# Patient Record
Sex: Male | Born: 1970 | Hispanic: Yes | Marital: Single | Smoking: Never smoker
Health system: Southern US, Community
[De-identification: ages and names within clinical notes are randomized; demographics above are authoritative.]

---

## 2017-07-01 ENCOUNTER — Encounter (HOSPITAL_COMMUNITY): Payer: Self-pay | Admitting: Emergency Medicine

## 2017-07-01 ENCOUNTER — Emergency Department (HOSPITAL_COMMUNITY)
Admission: EM | Admit: 2017-07-01 | Discharge: 2017-07-01 | Disposition: A | Discharge status: Home / Self Care | Payer: Worker's Compensation | Attending: Emergency Medicine | Admitting: Emergency Medicine

## 2017-07-01 ENCOUNTER — Emergency Department (HOSPITAL_COMMUNITY): Payer: Worker's Compensation

## 2017-07-01 DIAGNOSIS — Z23 Encounter for immunization: Secondary | ICD-10-CM | POA: Diagnosis not present

## 2017-07-01 DIAGNOSIS — Y939 Activity, unspecified: Secondary | ICD-10-CM | POA: Insufficient documentation

## 2017-07-01 DIAGNOSIS — W1789XA Other fall from one level to another, initial encounter: Secondary | ICD-10-CM | POA: Diagnosis not present

## 2017-07-01 DIAGNOSIS — Y990 Civilian activity done for income or pay: Secondary | ICD-10-CM | POA: Diagnosis not present

## 2017-07-01 DIAGNOSIS — S0101XA Laceration without foreign body of scalp, initial encounter: Secondary | ICD-10-CM | POA: Insufficient documentation

## 2017-07-01 DIAGNOSIS — S62524A Nondisplaced fracture of distal phalanx of right thumb, initial encounter for closed fracture: Secondary | ICD-10-CM

## 2017-07-01 DIAGNOSIS — S0990XA Unspecified injury of head, initial encounter: Secondary | ICD-10-CM | POA: Diagnosis present

## 2017-07-01 DIAGNOSIS — Y929 Unspecified place or not applicable: Secondary | ICD-10-CM | POA: Insufficient documentation

## 2017-07-01 DIAGNOSIS — S62522A Displaced fracture of distal phalanx of left thumb, initial encounter for closed fracture: Secondary | ICD-10-CM | POA: Insufficient documentation

## 2017-07-01 MED ORDER — TETANUS-DIPHTH-ACELL PERTUSSIS 5-2.5-18.5 LF-MCG/0.5 IM SUSP
0.5000 mL | Freq: Once | INTRAMUSCULAR | Status: AC
  Administered 2017-07-01: 0.5 mL via INTRAMUSCULAR
  Filled 2017-07-01: qty 0.5

## 2017-07-01 MED ORDER — HYDROCODONE-ACETAMINOPHEN 5-325 MG PO TABS: 2.0000 | ORAL_TABLET | ORAL | 0 refills | Status: AC | PRN

## 2017-07-01 NOTE — ED Triage Notes (Signed)
Pt comes in after being at work and falling off a 3 foot piece of machinery and hit his head on the ground and injured his left thumb joint and his right middle finger. Pt ambulatory and A&O x4 at this time.  Small 1 cm laceration noted to top of head with bleeding controlled at this time.  No LOC but does endorse feeling dizzy when he hit his head.  No blood thinners. Unsure of last tetanus.  Pt employer at bedside and denies the need for a urine drug screen.

## 2017-07-03 NOTE — ED Provider Notes (Signed)
[facility]-EMERGENCY DEPT Provider Note   CSN: [id number] Arrival date & time: 07/01/17  1306     History   Chief Complaint Chief Complaint  Patient presents with  . Finger Injury  . Head Injury    HPI Danny Owen is a 46 y.o. male.  The history is provided by the patient. No language interpreter was used.  Head Injury   The incident occurred more than 2 days ago. He came to the ER via walk-in. There was no loss of consciousness. The volume of blood lost was moderate. The pain is at a severity of 5/10. The pain is moderate. The pain has been constant since the injury. He was found conscious by EMS personnel. He has tried nothing for the symptoms. The treatment provided no relief.  Pt complains of falling and hit his  head. Pt also hit his left thumb and his right middle finger   History reviewed. No pertinent past medical history.  There are no active problems to display for this patient.   History reviewed. No pertinent surgical history.     Home Medications    Prior to Admission medications   Medication Sig Start Date End Date Taking? Authorizing Provider  HYDROcodone-acetaminophen (NORCO/VICODIN) 5-325 MG tablet Take 2 tablets by mouth every 4 (four) hours as needed. 07/01/17   Elson AreasSofia, Leslie K, PA-C    Family History No family history on file.  Social History Social History   Tobacco Use  . Smoking status: Never Smoker  . Smokeless tobacco: Never Used  Substance Use Topics  . Alcohol use: No    Frequency: Never  . Drug use: No     Allergies   Patient has no known allergies.   Review of Systems Review of Systems  All other systems reviewed and are negative.    Physical Exam Updated Vital Signs BP 130/78 (BP Location: Right Arm)   Pulse 68   Temp 98.1 F (36.7 C) (Oral)   Resp 18   SpO2 100%   Physical Exam  Constitutional: He appears well-developed and well-nourished.  HENT:  Head: Normocephalic.  5mm  superficial laceration scalp  No gapping  Eyes: Pupils are equal, round, and reactive to light.  Neck: Normal range of motion.  Cardiovascular: Normal rate and regular rhythm.  Pulmonary/Chest: Effort normal.  Abdominal: Soft.  Musculoskeletal: Normal range of motion.  Swollen left thumb,  Tender distal and proximal phalanx from  nv and ns intact  Neurological: He is alert.  Skin: Skin is warm.  Nursing note and vitals reviewed.    ED Treatments / Results  Labs (all labs ordered are listed, but only abnormal results are displayed) Labs Reviewed - No data to display  EKG  EKG Interpretation None       Radiology Dg Hand Complete Left  Result Date: 07/01/2017 CLINICAL DATA:  Fall with thumb injury. EXAM: LEFT HAND - COMPLETE 3+ VIEW COMPARISON:  None. FINDINGS: There is a minimally displaced transverse fracture of the left thumb distal phalanx. Additionally, there is subluxation at the first metacarpophalangeal joint in the volar and medial directions. There is irregularity at the distal aspect of the first metacarpal, which is superimposed on findings of osteoarthrosis and/or old trauma at this location. There is a metallic foreign body in the soft tissues of the left wrist, adjacent to the lateral aspect of the radius. IMPRESSION: 1. Acute transverse fracture of the left thumb distal phalanx, minimally displaced. 2. Volar and medial subluxation of the first  metacarpophalangeal joint. Possible fracture of the distal left thumb metacarpal superimposed on chronic osteoarthritis and/or old trauma. Electronically Signed   By: Deatra RobinsonKevin  Herman M.D.   On: 07/01/2017 14:14   Dg Finger Middle Right  Result Date: 07/01/2017 CLINICAL DATA:  Fall EXAM: RIGHT MIDDLE FINGER 2+V COMPARISON:  None. FINDINGS: There is no evidence of fracture or dislocation. There is no evidence of arthropathy or other focal bone abnormality. Soft tissues are unremarkable. IMPRESSION: No fracture or dislocation of the  right middle finger. Electronically Signed   By: Deatra RobinsonKevin  Herman M.D.   On: 07/01/2017 14:16    Procedures Procedures (including critical care time)  Medications Ordered in ED Medications  Tdap (BOOSTRIX) injection 0.5 mL (0.5 mLs Intramuscular Given 07/01/17 1535)     Initial Impression / Assessment and Plan / ED Course  I have reviewed the triage vital signs and the nursing notes.  Pertinent labs & imaging results that were available during my care of the patient were reviewed by me and considered in my medical decision making (see chart for details).     Pt placed in a splint.   Pt advised he will need to see Hand surgeon for evaluation    Final Clinical Impressions(s) / ED Diagnoses   Final diagnoses:  Laceration of scalp, initial encounter  Closed nondisplaced fracture of distal phalanx of right thumb, initial encounter    ED Discharge Orders        Ordered    HYDROcodone-acetaminophen (NORCO/VICODIN) 5-325 MG tablet  Every 4 hours PRN     07/01/17 1524    An After Visit Summary was printed and given to the patient.   Elson AreasSofia, Leslie K, [place]-C 07/03/17 [id number]    Gerhard MunchLockwood, Robert, MD 07/03/17 [phone]

## 2018-12-21 IMAGING — CR DG HAND COMPLETE 3+V*L*
3 series · 3 of 3 positions shown · non-contrast
Comparison: None.

CLINICAL DATA: Fall with thumb injury.

EXAM:
LEFT HAND - COMPLETE 3+ VIEW

[x hand pa left]
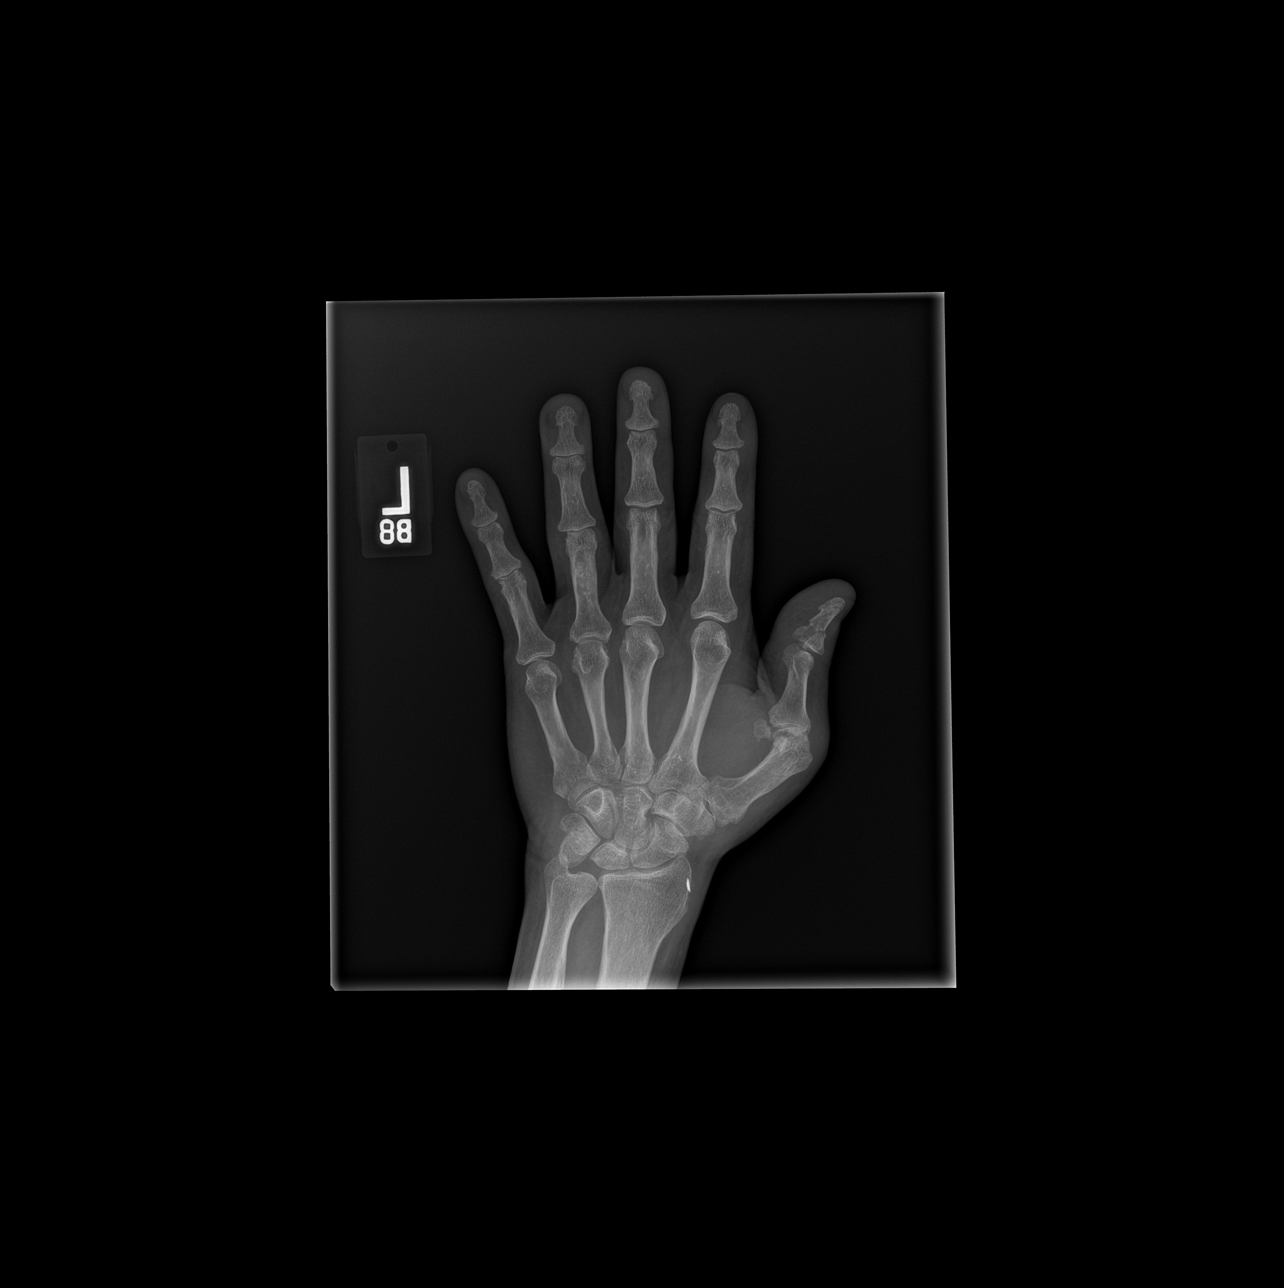

[x hand obl left]
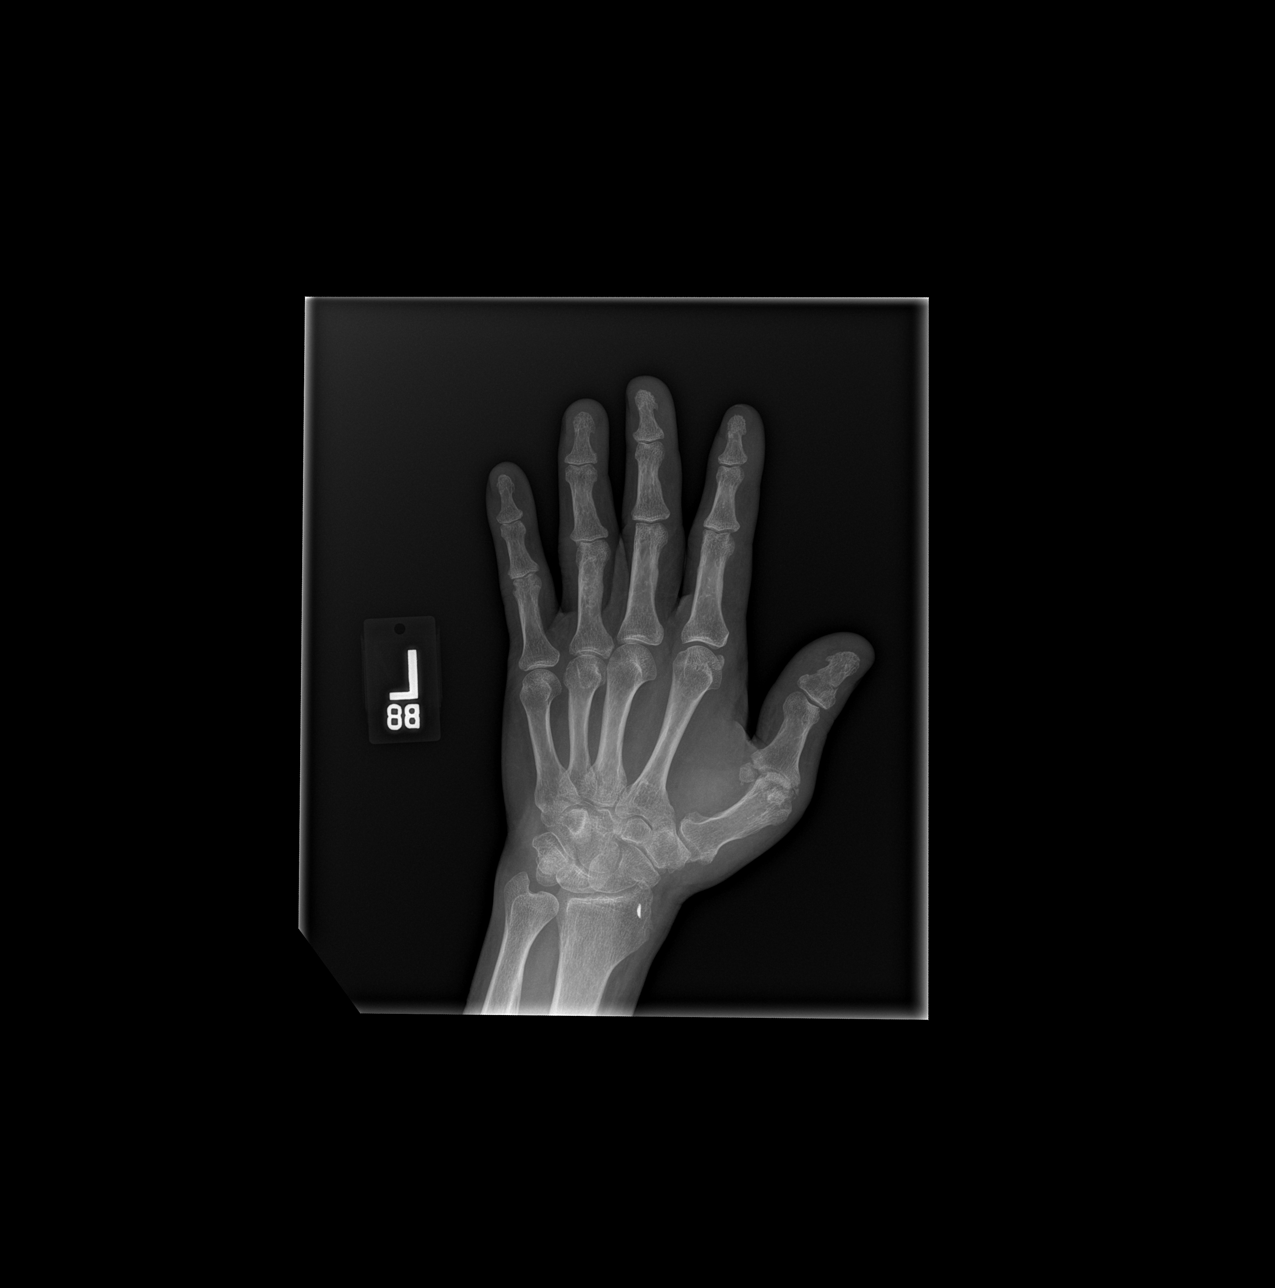

[x hand lat left]
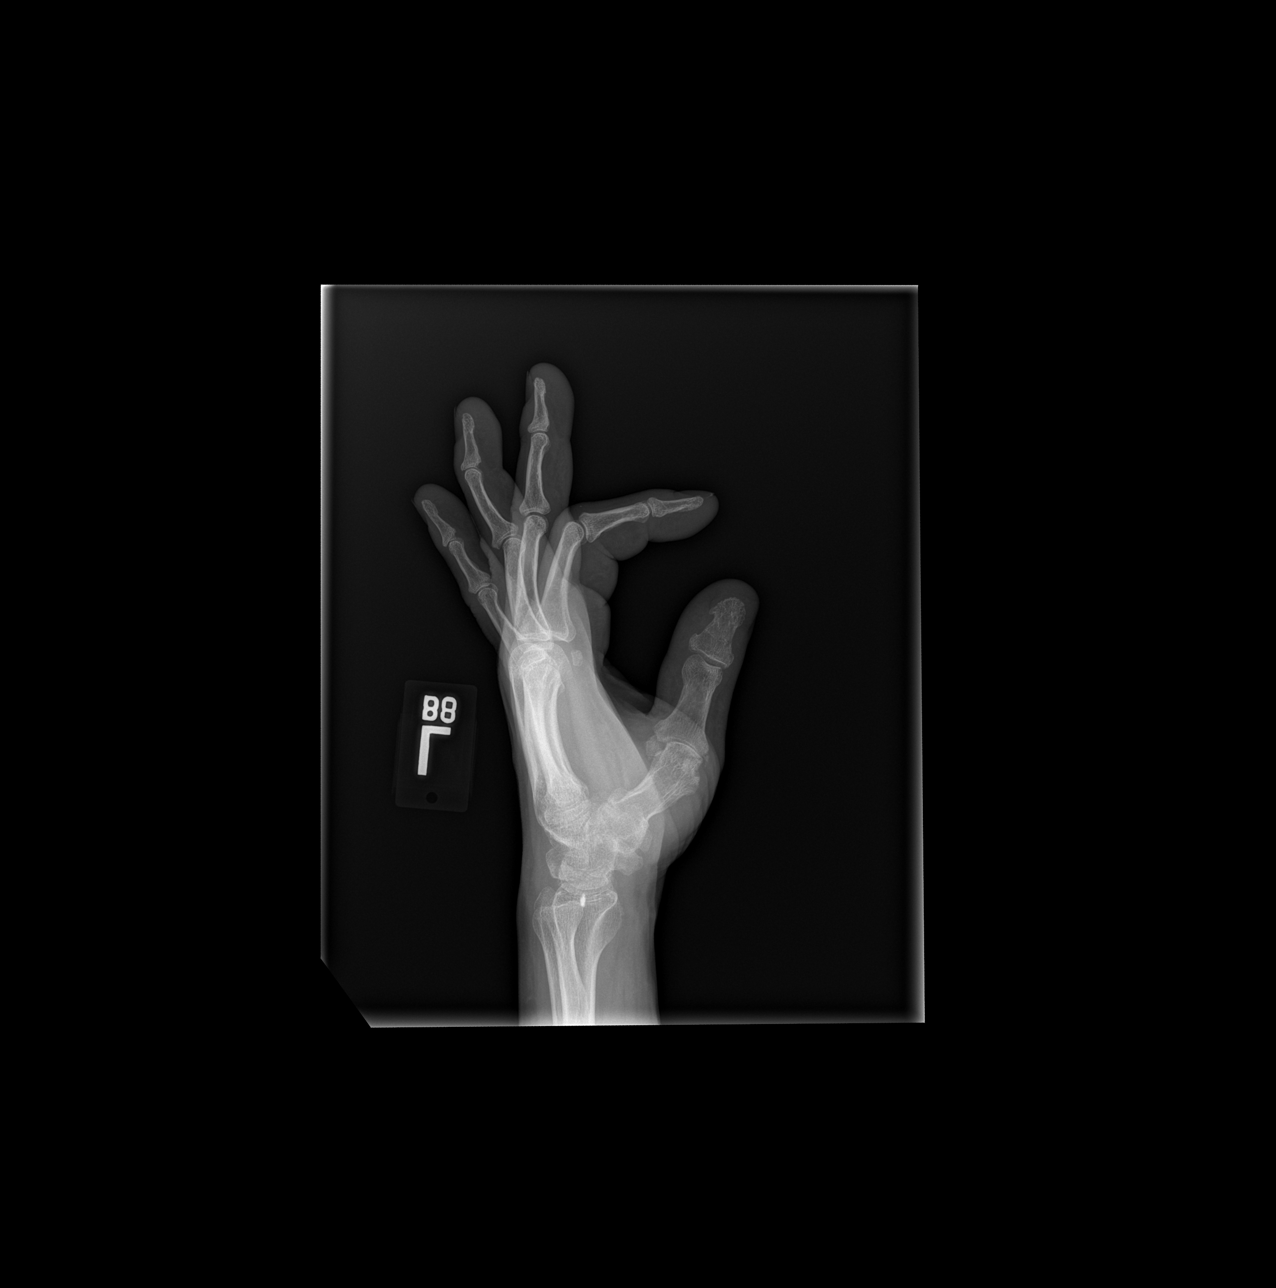

[3 of 3 positions shown; findings below may reference images not displayed]

FINDINGS: There is a minimally displaced transverse fracture of the left thumb
distal phalanx. Additionally, there is subluxation at the first
metacarpophalangeal joint in the volar and medial directions. There
is irregularity at the distal aspect of the first metacarpal, which
is superimposed on findings of osteoarthrosis and/or old trauma at
this location. There is a metallic foreign body in the soft tissues
of the left wrist, adjacent to the lateral aspect of the radius.
IMPRESSION: 1. Acute transverse fracture of the left thumb distal phalanx,
minimally displaced.
2. Volar and medial subluxation of the first metacarpophalangeal
joint. Possible fracture of the distal left thumb metacarpal
superimposed on chronic osteoarthritis and/or old trauma.
